# Patient Record
Sex: Female | Born: 2000 | Race: Black or African American | Hispanic: No | Marital: Single | Smoking: Never smoker
Health system: Southern US, Community
[De-identification: ages and names within clinical notes are randomized; demographics above are authoritative.]

---
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
## 2022-01-01 ENCOUNTER — Ambulatory Visit (HOSPITAL_COMMUNITY): Payer: BC Managed Care – PPO | Attending: Neurosurgery

## 2022-01-01 DIAGNOSIS — M545 Low back pain, unspecified: Secondary | ICD-10-CM | POA: Insufficient documentation

## 2022-01-01 DIAGNOSIS — M549 Dorsalgia, unspecified: Secondary | ICD-10-CM | POA: Diagnosis present

## 2022-01-01 DIAGNOSIS — M5430 Sciatica, unspecified side: Secondary | ICD-10-CM | POA: Insufficient documentation

## 2022-01-01 NOTE — Therapy (Signed)
OUTPATIENT PHYSICAL THERAPY THORACOLUMBAR TREATMENT   Patient Name: Abigail [name] MRN: [id number] DOB:[birth year], 21 y.o., female Today's Date: 01/01/2022   PT End of Session - 01/01/22 1443     Visit Number 6    Number of Visits 12    Date for PT Re-Evaluation 01/24/22   unable to get right back in.   Authorization Type [employer] COMM PPO; no auth required, 30 VL PT/OT and chiro combined; no copay    Authorization - Visit Number 6    Authorization - Number of Visits 30    Progress Note Due on Visit 12    PT Start Time 1440   late   PT Stop Time 1519    PT Time Calculation (min) 39 min    Activity Tolerance Patient tolerated treatment well    Behavior During Therapy WFL for tasks assessed/performed                No past medical history on file. No past surgical history on file. Patient Active Problem List   Diagnosis Date Noted   MDD (major depressive disorder), single episode, severe , no psychosis ([place]) 11/19/2018   Suicidal overdose, subsequent encounter 11/19/2018    PCP: [facility]  REFERRING PROVIDER: Kary Kos, MD  REFERRING DIAG: PT eval/tx for M54.50 lumbago of lumbar region with sciatica per Kary Kos, MD   Rationale for Evaluation and Treatment Rehabilitation  THERAPY DIAG:  Low back pain, unspecified back pain laterality, unspecified chronicity, unspecified whether sciatica present  Back pain with sciatica  ONSET DATE: October and November of last year 2022  SUBJECTIVE:                                                                                                                                                                                           SUBJECTIVE STATEMENT:  pain is a little less overall.  Still feel the pain in the same spot.  PERTINENT HISTORY:  none  PAIN:  Are you having pain? Yes NPRS scale: 3/10 Pain location: low back Pain description: stabbing Aggravating factors: moving Relieving factors:  tylenol, ibuprofen   PLOF: Independent  PATIENT GOALS find out what my problem is and limit the pain   OBJECTIVE:   PATIENT SURVEYS:  FOTO 53   POSTURE: No Significant postural limitations; sits with slightly flexed posture but able to correct with verbal cues.   PALPATION: Tender L2-L5 paraspinals   LUMBAR ROM:   Active  AROM  eval AROM 01/01/22  Flexion 90% available painful Wfl No painful  Extension 70% available painful 80%  Available Mild pain  Right lateral flexion  wfl   Left lateral flexion wfl   Right rotation wfl   Left rotation wfl    (Blank rows = not tested)   LOWER EXTREMITY MMT:    MMT Right eval Left eval  Hip flexion 4+ 4+  Hip extension 4+ 4+  Hip abduction    Hip adduction    Hip internal rotation    Hip external rotation    Knee flexion    Knee extension 5 5  Ankle dorsiflexion 5 5  Ankle plantarflexion    Ankle inversion    Ankle eversion     (Blank rows = not tested)   FUNCTIONAL TESTS:  5 times sit to stand: 21 sec     TODAY'S TREATMENT  01/01/2022 Prone press ups 2 x 10 less pain with standing lumbar extension afterwards Bird dog x 10   Supine: Bridge 2 x 10 Dead bug 2 x 10 Bridge with ball hip adduction x 10 Bridge with hip abduction with belt x 10  Standing shoulder retractions RTB 2 x 10 Standing shoulder extensions RTB 2 x 10       December 30, 2021 Bridge 2x 10  Dead bug 2x 10 Prone hip extension 2x 10 bilateral Plank on elbows/knees 5x 10 second holds 2 sets Squat 2x 10  Palof press 1x10 GTB           12/18/21             Supine:  bridge x 10                           Heel slide with leg 1" off floor x 10             Side lying : clam x 10             All four :  bird dog x 5              Sitting : sit to stand x 10             Standing excursion x 3                             Heel raises x 10                            Squat x 10                             Wall pushup x 10   11/28/21:  Seated:  anterior/posterior pelvic tilt while therapist assessing SI alignment  Manual MET for Lt SI anterior rotation  Supine: following MET:   Bridge Lt foot closer   SLR floating Rt LE only   Pubic clearing 10x 5" alternating adduction ball with abduction in belt  81mn walk with ab set 452 ft  Paloff walk out 5RT        11/27/21 Reviewed goals, educated importance of HEP compliance for maximal benefits, reviewed current HEP. Prone:  Prone hip extension (cueing for form/mechanics)  Prone on elbow 1 min  Prone press up 10x 10"  Supine:  Bridge (cueing for core engangement) Cueing to reduce IR  Posterior pelvic tilt 10x 10"  Dead bug 10x5"  Clam with RTB 10x 5" each Quadruped:  Cat/camel 5x 10"  UE extension 5x  LE extension 10x  UE/LE 10x  Physical therapy evaluation, HEP instruction, postural instruction, use of heat and ice for pain management.   PATIENT EDUCATION:  Education details:  12/20/21: HEP Eval: HEP instruction, postural instruction, use of heat and ice for pain management. Person educated: Patient Education method: Explanation, Demonstration, and Handouts Education comprehension: verbalized understanding, returned demonstration, verbal cues required, tactile cues required, and needs further education   HOME EXERCISE PROGRAM:  Access Code: [email or web address] URL: [email or web address] Date: 01/01/2022 Prepared by: AP - Rehab  Exercises - Supine Posterior Pelvic Tilt  - 2-3 x daily - 7 x weekly - 1 sets - 5 reps - Supine Bridge  - 2-3 x daily - 7 x weekly - 3 sets - 10 reps - Prone Hip Extension  - 2-3 x daily - 7 x weekly - 3 sets - 10 reps - Plank on Knees  - 1 x daily - 7 x weekly - 2 sets - 5 reps - 10 second hold - Prone Press Up  - 1 x daily - 7 x weekly - 3 sets - 10 reps ASSESSMENT:  CLINICAL IMPRESSION: Today's session continued to focus on lumbar and core strengthening.  Added hip adduction with ball and hip abduction with belt without issue;  seems to have some relief with extensions today and no complaint of  pain at the end of treatment. Patient will continue to benefit from physical therapy in order to improve function and reduce impairment.    OBJECTIVE IMPAIRMENTS decreased activity tolerance, decreased coordination, decreased endurance, decreased knowledge of condition, decreased mobility, decreased ROM, decreased strength, hypomobility, impaired perceived functional ability, impaired flexibility, postural dysfunction, and pain.   ACTIVITY LIMITATIONS carrying, lifting, bending, sitting, standing, squatting, and transfers  PARTICIPATION LIMITATIONS: cleaning, laundry, shopping, community activity, occupation, and yard work  PERSONAL FACTORS Fitness are also affecting patient's functional outcome.   REHAB POTENTIAL: Good  CLINICAL DECISION MAKING: Stable/uncomplicated  EVALUATION COMPLEXITY: Low   GOALS: Goals reviewed with patient? No  SHORT TERM GOALS: Target date: 12/04/2021   patient will be independent with initial HEP  Baseline: Goal status: met    LONG TERM GOALS: Target date: 12/11/2021  Patient will be independent with advanced HEP and self management strategies to improve quality of life and functional outcomes.  Baseline:  Goal status: Ongoing  2.   Patient will improve FOTO score to predicted value to demonstrate improved functional mobility.  Baseline: 53 Goal status: Ongoing  3.  Patient will improve 5 x STS score from 21 sec to 15 sec to demonstrate improved functional mobility and increased lower extremity strength.  Baseline: 21 sec Goal status: Ongoing  4.  Patient will be able to demonstrate pain free lumbar motion in all motions/directions (flexion/extension/SB R/SB L/ ROT R/ ROT L)  Baseline:  Goal status: Ongoing   PLAN: PT FREQUENCY: 2x/week  PT DURATION: 3 weeks  PLANNED INTERVENTIONS: Therapeutic exercises, Therapeutic activity, Neuromuscular re-education, Balance  training, Gait training, Patient/Family education, Joint manipulation, Joint mobilization, Stair training, Vestibular training, Canalith repositioning, Visual/preceptual remediation/compensation, Orthotic/Fit training, DME instructions, Aquatic Therapy, Dry Needling, Electrical stimulation, Spinal manipulation, Spinal mobilization, Cryotherapy, Moist heat, Manual lymph drainage, Compression bandaging, scar mobilization, Taping, Traction, Ultrasound, Contrast bath, Biofeedback, Ionotophoresis 39m/ml Dexamethasone, Manual therapy, and Re-evaluation.  PLAN FOR NEXT SESSION: Progress lumbar stabilization  Progress core strength with additional planks, and higher stability exercises.  3:15 PM, 01/01/22 [place] Neita Landrigan MPT [facility] [place] #[phone]

## 2022-01-03 ENCOUNTER — Ambulatory Visit (HOSPITAL_COMMUNITY): Payer: BC Managed Care – PPO

## 2022-01-03 ENCOUNTER — Encounter (HOSPITAL_COMMUNITY): Payer: BC Managed Care – PPO

## 2022-01-03 DIAGNOSIS — M5430 Sciatica, unspecified side: Secondary | ICD-10-CM

## 2022-01-03 DIAGNOSIS — M545 Low back pain, unspecified: Secondary | ICD-10-CM

## 2022-01-03 DIAGNOSIS — M549 Dorsalgia, unspecified: Secondary | ICD-10-CM

## 2022-01-03 NOTE — Therapy (Signed)
OUTPATIENT PHYSICAL THERAPY THORACOLUMBAR TREATMENT   Patient Name: Abigail [name] MRN: [id number] DOB:Dec 31, 2000, 21 y.o., female Today's Date: 01/03/2022   PT End of Session - 01/03/22 0820     Visit Number 7    Number of Visits 12    Date for PT Re-Evaluation 01/24/22   unable to get right back in.   Authorization Type [employer] COMM PPO; no auth required, 30 VL PT/OT and chiro combined; no copay    Authorization - Visit Number 6    Authorization - Number of Visits 30    Progress Note Due on Visit 12    PT Start Time 0820    PT Stop Time 0900    PT Time Calculation (min) 40 min    Activity Tolerance Patient tolerated treatment well    Behavior During Therapy WFL for tasks assessed/performed                 No past medical history on file. No past surgical history on file. Patient Active Problem List   Diagnosis Date Noted   MDD (major depressive disorder), single episode, severe , no psychosis ([place]) 11/19/2018   Suicidal overdose, subsequent encounter 11/19/2018    PCP: [facility]  REFERRING PROVIDER: Kary Kos, MD  REFERRING DIAG: PT eval/tx for M54.50 lumbago of lumbar region with sciatica per Kary Kos, MD   Rationale for Evaluation and Treatment Rehabilitation  THERAPY DIAG:  Low back pain, unspecified back pain laterality, unspecified chronicity, unspecified whether sciatica present  Back pain with sciatica  ONSET DATE: October and November of last year 2022  SUBJECTIVE:                                                                                                                                                                                           SUBJECTIVE STATEMENT:  no pain today  PERTINENT HISTORY:  none  PAIN:  Are you having pain? Yes NPRS scale: 0/10 Pain location: low back Pain description: stabbing Aggravating factors: moving Relieving factors: tylenol, ibuprofen   PLOF: Independent  PATIENT GOALS find  out what my problem is and limit the pain   OBJECTIVE:   PATIENT SURVEYS:  FOTO 53   POSTURE: No Significant postural limitations; sits with slightly flexed posture but able to correct with verbal cues.   PALPATION: Tender L2-L5 paraspinals   LUMBAR ROM:   Active  AROM  eval AROM 01/01/22  Flexion 90% available painful Wfl No painful  Extension 70% available painful 80%  Available Mild pain  Right lateral flexion wfl   Left lateral flexion wfl   Right rotation wfl  Left rotation wfl    (Blank rows = not tested)   LOWER EXTREMITY MMT:    MMT Right eval Left eval  Hip flexion 4+ 4+  Hip extension 4+ 4+  Hip abduction    Hip adduction    Hip internal rotation    Hip external rotation    Knee flexion    Knee extension 5 5  Ankle dorsiflexion 5 5  Ankle plantarflexion    Ankle inversion    Ankle eversion     (Blank rows = not tested)   FUNCTIONAL TESTS:  5 times sit to stand: 21 sec     TODAY'S TREATMENT  01/03/22 Prone press ups 2 x 10 ( at the beginning and end of mat exercise) Plank 2 x 20"  Supine: Bridge 2 x 10 Dead bug 2 x 10 with 2# each upper extremity Bridge with ball hip adduction 2  x 10 Bridge with hip abduction with belt 2 x 10  Quadruped  Alternate arm/leg (bird dog) x 10  Nustep seat 5, arms 6 level 4 x 5'  Standing: GTB shoulder rows and extension 2 x 10    01/01/2022 Prone press ups 2 x 10 less pain with standing lumbar extension afterwards Bird dog x 10   Supine: Bridge 2 x 10 Dead bug 2 x 10 Bridge with ball hip adduction x 10 Bridge with hip abduction with belt x 10  Standing shoulder retractions RTB 2 x 10 Standing shoulder extensions RTB 2 x 10       01/16/22 Bridge 2x 10  Dead bug 2x 10 Prone hip extension 2x 10 bilateral Plank on elbows/knees 5x 10 second holds 2 sets Squat 2x 10  Palof press 1x10 GTB           12/18/21             Supine:  bridge x 10                           Heel slide with  leg 1" off floor x 10             Side lying : clam x 10             All four :  bird dog x 5              Sitting : sit to stand x 10             Standing excursion x 3                             Heel raises x 10                            Squat x 10                             Wall pushup x 10   11/28/21:  Seated: anterior/posterior pelvic tilt while therapist assessing SI alignment  Manual MET for Lt SI anterior rotation  Supine: following MET:   Bridge Lt foot closer   SLR floating Rt LE only   Pubic clearing 10x 5" alternating adduction ball with abduction in belt  63mn walk with ab set 452 ft  Paloff walk out 5RT        11/27/21  Reviewed goals, educated importance of HEP compliance for maximal benefits, reviewed current HEP. Prone:  Prone hip extension (cueing for form/mechanics)  Prone on elbow 1 min  Prone press up 10x 10"  Supine:  Bridge (cueing for core engangement) Cueing to reduce IR  Posterior pelvic tilt 10x 10"  Dead bug 10x5"  Clam with RTB 10x 5" each Quadruped:  Cat/camel 5x 10"  UE extension 5x   LE extension 10x  UE/LE 10x  Physical therapy evaluation, HEP instruction, postural instruction, use of heat and ice for pain management.   PATIENT EDUCATION:  Education details: 01/03/22 added plank to HEP 12/20/21: HEP Eval: HEP instruction, postural instruction, use of heat and ice for pain management. Person educated: Patient Education method: Explanation, Demonstration, and Handouts Education comprehension: verbalized understanding, returned demonstration, verbal cues required, tactile cues required, and needs further education   HOME EXERCISE PROGRAM: Access Code: [email or web address] URL: [email or web address] Date: 01/03/2022 Prepared by: AP - Rehab  Exercises - Supine Posterior Pelvic Tilt  - 2-3 x daily - 7 x weekly - 1 sets - 5 reps - Supine Bridge  - 2-3 x daily - 7 x weekly - 3 sets - 10 reps - Prone Hip Extension  - 2-3 x daily - 7 x  weekly - 3 sets - 10 reps - Plank on Knees  - 1 x daily - 7 x weekly - 2 sets - 5 reps - 10 second hold - Prone Press Up  - 2 x daily - 7 x weekly - 1 sets - 10 reps - Standard Plank  - 2 x daily - 7 x weekly - 1 sets - 2 reps - 20" hold   ASSESSMENT:  CLINICAL IMPRESSION:  Patient reports no pain at the start of treatment. Today's session continued to focus on lumbar and core strengthening.  Progressed bridging exercise with reps and added weight to dead bug.  Added plank today; patient fatigues quickly and needs cues for technique with plank. Patient will continue to benefit from physical therapy in order to improve function and reduce impairment.    OBJECTIVE IMPAIRMENTS decreased activity tolerance, decreased coordination, decreased endurance, decreased knowledge of condition, decreased mobility, decreased ROM, decreased strength, hypomobility, impaired perceived functional ability, impaired flexibility, postural dysfunction, and pain.   ACTIVITY LIMITATIONS carrying, lifting, bending, sitting, standing, squatting, and transfers  PARTICIPATION LIMITATIONS: cleaning, laundry, shopping, community activity, occupation, and yard work  PERSONAL FACTORS Fitness are also affecting patient's functional outcome.   REHAB POTENTIAL: Good  CLINICAL DECISION MAKING: Stable/uncomplicated  EVALUATION COMPLEXITY: Low   GOALS: Goals reviewed with patient? No  SHORT TERM GOALS: Target date: 12/04/2021   patient will be independent with initial HEP  Baseline: Goal status: met    LONG TERM GOALS: Target date: 12/11/2021  Patient will be independent with advanced HEP and self management strategies to improve quality of life and functional outcomes.  Baseline:  Goal status: Ongoing  2.   Patient will improve FOTO score to predicted value to demonstrate improved functional mobility.  Baseline: 53 Goal status: Ongoing  3.  Patient will improve 5 x STS score from 21 sec to 15 sec to  demonstrate improved functional mobility and increased lower extremity strength.  Baseline: 21 sec Goal status: Ongoing  4.  Patient will be able to demonstrate pain free lumbar motion in all motions/directions (flexion/extension/SB R/SB L/ ROT R/ ROT L)  Baseline:  Goal status: Ongoing   PLAN: PT FREQUENCY: 2x/week  PT DURATION: 3 weeks  PLANNED INTERVENTIONS:  Therapeutic exercises, Therapeutic activity, Neuromuscular re-education, Balance training, Gait training, Patient/Family education, Joint manipulation, Joint mobilization, Stair training, Vestibular training, Canalith repositioning, Visual/preceptual remediation/compensation, Orthotic/Fit training, DME instructions, Aquatic Therapy, Dry Needling, Electrical stimulation, Spinal manipulation, Spinal mobilization, Cryotherapy, Moist heat, Manual lymph drainage, Compression bandaging, scar mobilization, Taping, Traction, Ultrasound, Contrast bath, Biofeedback, Ionotophoresis 42m/ml Dexamethasone, Manual therapy, and Re-evaluation.  PLAN FOR NEXT SESSION: Progress lumbar stabilization  Progress core strength with additional planks, and higher stability exercises.  8:54 AM, 01/03/22 [place] Arnaldo Heffron MPT [facility] [place] #[phone]

## 2022-01-08 ENCOUNTER — Encounter (HOSPITAL_COMMUNITY): Payer: BC Managed Care – PPO | Admitting: Physical Therapy

## 2022-01-10 ENCOUNTER — Encounter (HOSPITAL_COMMUNITY): Payer: Self-pay | Admitting: Physical Therapy

## 2022-01-10 ENCOUNTER — Ambulatory Visit (HOSPITAL_COMMUNITY): Payer: BC Managed Care – PPO | Admitting: Physical Therapy

## 2022-01-10 DIAGNOSIS — M549 Dorsalgia, unspecified: Secondary | ICD-10-CM

## 2022-01-10 DIAGNOSIS — M545 Low back pain, unspecified: Secondary | ICD-10-CM | POA: Diagnosis not present

## 2022-01-10 NOTE — Therapy (Signed)
OUTPATIENT PHYSICAL THERAPY THORACOLUMBAR TREATMENT   Patient Name: Abigail [name] MRN: [id number] DOB:09-28-2000, 21 y.o., female Today's Date: 01/10/2022   PT End of Session - 01/10/22 0735     Visit Number 8    Number of Visits 12    Date for PT Re-Evaluation 01/24/22   unable to get right back in.   Authorization Type [employer] COMM PPO; no auth required, 30 VL PT/OT and chiro combined; no copay; Secondary [employer]    Authorization Time Period well[employer] approved 10 visits from 5/30 - 7/29    Authorization - Visit Number 8    Authorization - Number of Visits 10    Progress Note Due on Visit 12    PT Start Time 0735    PT Stop Time 0813    PT Time Calculation (min) 38 min    Activity Tolerance Patient tolerated treatment well    Behavior During Therapy [facility] for tasks assessed/performed                 History reviewed. No pertinent past medical history. History reviewed. No pertinent surgical history. Patient Active Problem List   Diagnosis Date Noted   MDD (major depressive disorder), single episode, severe , no psychosis ([place]) 11/19/2018   Suicidal overdose, subsequent encounter 11/19/2018    PCP: [facility]  REFERRING PROVIDER: Kary Kos, MD  REFERRING DIAG: PT eval/tx for M54.50 lumbago of lumbar region with sciatica per Kary Kos, MD   Rationale for Evaluation and Treatment Rehabilitation  THERAPY DIAG:  Low back pain, unspecified back pain laterality, unspecified chronicity, unspecified whether sciatica present  Back pain with sciatica  ONSET DATE: October and November of last year 2022  SUBJECTIVE:                                                                                                                                                                                           SUBJECTIVE STATEMENT:  Sometimes pain with moving a certain way but not as bad as it was. Feels she will be ready to d/c at end of POC.  PERTINENT  HISTORY:  none  PAIN:  Are you having pain? Yes NPRS scale: 0/10 Pain location: low back Pain description: stabbing Aggravating factors: moving Relieving factors: tylenol, ibuprofen   PLOF: Independent  PATIENT GOALS find out what my problem is and limit the pain   OBJECTIVE:   PATIENT SURVEYS:  FOTO 53   POSTURE: No Significant postural limitations; sits with slightly flexed posture but able to correct with verbal cues.   PALPATION: Tender L2-L5 paraspinals   LUMBAR ROM:   Active  AROM  eval AROM 01/01/22  Flexion 90% available painful Wfl No painful  Extension 70% available painful 80%  Available Mild pain  Right lateral flexion wfl   Left lateral flexion wfl   Right rotation wfl   Left rotation wfl    (Blank rows = not tested)   LOWER EXTREMITY MMT:    MMT Right eval Left eval  Hip flexion 4+ 4+  Hip extension 4+ 4+  Hip abduction    Hip adduction    Hip internal rotation    Hip external rotation    Knee flexion    Knee extension 5 5  Ankle dorsiflexion 5 5  Ankle plantarflexion    Ankle inversion    Ankle eversion     (Blank rows = not tested)   FUNCTIONAL TESTS:  5 times sit to stand: 21 sec     TODAY'S TREATMENT  01/10/22 Prone press up 2x 10  Plank 3x 20 second holds Birddog 2x 10 bilateral  Dead bug 2x 10 Squat 8# dumbbell 2x 10  Sumo squat 8# 2x 10  Lateral walkouts 2 plates 1x 5 bilateral    01/03/22 Prone press ups 2 x 10 ( at the beginning and end of mat exercise) Plank 2 x 20"  Supine: Bridge 2 x 10 Dead bug 2 x 10 with 2# each upper extremity Bridge with ball hip adduction 2  x 10 Bridge with hip abduction with belt 2 x 10  Quadruped  Alternate arm/leg (bird dog) x 10  Nustep seat 5, arms 6 level 4 x 5'  Standing: GTB shoulder rows and extension 2 x 10    01/01/2022 Prone press ups 2 x 10 less pain with standing lumbar extension afterwards Bird dog x 10   Supine: Bridge 2 x 10 Dead bug 2 x  10 Bridge with ball hip adduction x 10 Bridge with hip abduction with belt x 10  Standing shoulder retractions RTB 2 x 10 Standing shoulder extensions RTB 2 x 10    01-14-22 Bridge 2x 10  Dead bug 2x 10 Prone hip extension 2x 10 bilateral Plank on elbows/knees 5x 10 second holds 2 sets Squat 2x 10  Palof press 1x10 GTB           PATIENT EDUCATION:  Education details: 01/03/22 added plank to HEP January 14, 2022: HEP Eval: HEP instruction, postural instruction, use of heat and ice for pain management. Person educated: Patient Education method: Explanation, Demonstration, and Handouts Education comprehension: verbalized understanding, returned demonstration, verbal cues required, tactile cues required, and needs further education   HOME EXERCISE PROGRAM: Access Code: [email or web address] URL: [email or web address] Date: 01/03/2022 Prepared by: [facility] - Rehab  Exercises - Supine Posterior Pelvic Tilt  - 2-3 x daily - 7 x weekly - 1 sets - 5 reps - Supine Bridge  - 2-3 x daily - 7 x weekly - 3 sets - 10 reps - Prone Hip Extension  - 2-3 x daily - 7 x weekly - 3 sets - 10 reps - Plank on Knees  - 1 x daily - 7 x weekly - 2 sets - 5 reps - 10 second hold - Prone Press Up  - 2 x daily - 7 x weekly - 1 sets - 10 reps - Standard Plank  - 2 x daily - 7 x weekly - 1 sets - 2 reps - 20" hold   ASSESSMENT:  CLINICAL IMPRESSION: Began session with press ups and core strengthening exercises at table which are tolerated well. She demonstrates good mechanics  with weighted squatting exercises. Anticipate d/c at end of POC. Patient will continue to benefit from physical therapy in order to improve function and reduce impairment.    OBJECTIVE IMPAIRMENTS decreased activity tolerance, decreased coordination, decreased endurance, decreased knowledge of condition, decreased mobility, decreased ROM, decreased strength, hypomobility, impaired perceived functional ability, impaired flexibility, postural  dysfunction, and pain.   ACTIVITY LIMITATIONS carrying, lifting, bending, sitting, standing, squatting, and transfers  PARTICIPATION LIMITATIONS: cleaning, laundry, shopping, community activity, occupation, and yard work  PERSONAL FACTORS Fitness are also affecting patient's functional outcome.   REHAB POTENTIAL: Good  CLINICAL DECISION MAKING: Stable/uncomplicated  EVALUATION COMPLEXITY: Low   GOALS: Goals reviewed with patient? No  SHORT TERM GOALS: Target date: 12/04/2021   patient will be independent with initial HEP  Baseline: Goal status: met    LONG TERM GOALS: Target date: 12/11/2021  Patient will be independent with advanced HEP and self management strategies to improve quality of life and functional outcomes.  Baseline:  Goal status: Ongoing  2.   Patient will improve FOTO score to predicted value to demonstrate improved functional mobility.  Baseline: 53 Goal status: Ongoing  3.  Patient will improve 5 x STS score from 21 sec to 15 sec to demonstrate improved functional mobility and increased lower extremity strength.  Baseline: 21 sec Goal status: Ongoing  4.  Patient will be able to demonstrate pain free lumbar motion in all motions/directions (flexion/extension/SB R/SB L/ ROT R/ ROT L)  Baseline:  Goal status: Ongoing   PLAN: PT FREQUENCY: 2x/week  PT DURATION: 3 weeks  PLANNED INTERVENTIONS: Therapeutic exercises, Therapeutic activity, Neuromuscular re-education, Balance training, Gait training, Patient/Family education, Joint manipulation, Joint mobilization, Stair training, Vestibular training, Canalith repositioning, Visual/preceptual remediation/compensation, Orthotic/Fit training, DME instructions, Aquatic Therapy, Dry Needling, Electrical stimulation, Spinal manipulation, Spinal mobilization, Cryotherapy, Moist heat, Manual lymph drainage, Compression bandaging, scar mobilization, Taping, Traction, Ultrasound, Contrast bath, Biofeedback,  Ionotophoresis 24m/ml Dexamethasone, Manual therapy, and Re-evaluation.  PLAN FOR NEXT SESSION: Progress lumbar stabilization  Progress core strength with additional planks, and higher stability exercises.  7:39 AM, 01/10/22 AMearl LatinPT, DPT [facility] The[facility]

## 2022-01-12 ENCOUNTER — Ambulatory Visit (HOSPITAL_COMMUNITY)
Admission: EM | Admit: 2022-01-12 | Discharge: 2022-01-12 | Disposition: A | Discharge status: Home / Self Care | Payer: BC Managed Care – PPO

## 2022-01-12 ENCOUNTER — Encounter (HOSPITAL_COMMUNITY): Payer: Self-pay | Admitting: Emergency Medicine

## 2022-01-12 DIAGNOSIS — R21 Rash and other nonspecific skin eruption: Secondary | ICD-10-CM

## 2022-01-12 MED ORDER — PREDNISONE 20 MG PO TABS: 40.0000 mg | ORAL_TABLET | Freq: Every day | ORAL | 0 refills | Status: AC

## 2022-01-12 MED ORDER — HYDROCORTISONE 1 % EX CREA: TOPICAL_CREAM | CUTANEOUS | 0 refills | Status: AC

## 2022-01-12 NOTE — ED Provider Notes (Signed)
[facility]    CSN: [id number] Arrival date & time: 01/12/22  1620      History   Chief Complaint Chief Complaint  Patient presents with   Rash    HPI Abigail [name] is a 21 y.o. female.   Patient presents to urgent care for evaluation of rash to her left arm that started a few days ago and has now spread to her chest and chin.  Rash is very itchy.  Patient denies recent exposure to new allergens or new bedding, personal hygiene products, or outdoor plants.  She denies recent travel, camping, and exposure to chemical irritants.  She believes that the rash may have been caused by her pets and states that her dogs typically go outside and run in the bushes/woods frequently.  Her dogs are up-to-date on all of their vaccines and flea and tick prevention.  No known poison ivy/oak in the backyard but states that they do have a large wooded area where the dogs typically like to run.  She has attempted use of calamine lotion to the rash on her face and her arm without much relief.  Rash is not currently draining but she states that it has gotten worse and spread significantly over the last few days.  No sore throat, nausea, vomiting, ear pain, URI symptoms, fever/chills, or headache.     History reviewed. No pertinent past medical history.  Patient Active Problem List   Diagnosis Date Noted   MDD (major depressive disorder), single episode, severe , no psychosis ([facility]) 11/19/2018   Suicidal overdose, subsequent encounter 11/19/2018    History reviewed. No pertinent surgical history.  OB History   No obstetric history on file.      Home Medications    Prior to Admission medications   Medication Sig Start Date End Date Taking? Authorizing Provider  hydrocortisone cream 1 % Apply to affected area 2 times daily 01/12/22  Yes Laporscha Linehan, Donavan Burnet, FNP  predniSONE (DELTASONE) 20 MG tablet Take 2 tablets (40 mg total) by mouth daily for 5 days. 01/12/22 01/17/22 Yes  StanhopeDonavan Burnet, FNP  DAYSEE 0.15-0.03 &0.01 MG tablet Take 1 tablet by mouth daily. 11/12/21   [provider]  medroxyPROGESTERone Acetate 150 MG/ML SUSY Inject 1 mL as directed every 3 (three) months. 09/03/18   [provider]  venlafaxine XR (EFFEXOR-XR) 150 MG 24 hr capsule Take 150 mg by mouth daily. 10/25/21   [provider]    Family History No family history on file.  Social History Social History   Tobacco Use   Smoking status: Never   Smokeless tobacco: Never  Vaping Use   Vaping Use: Never used  Substance Use Topics   Alcohol use: Never   Drug use: Never     Allergies   Patient has no known allergies.   Review of Systems Review of Systems Per HPI  Physical Exam Triage Vital Signs ED Triage Vitals  Enc Vitals Group     BP 01/12/22 1646 120/84     Pulse Rate 01/12/22 1646 83     Resp 01/12/22 1646 15     Temp 01/12/22 1646 98 F (36.7 C)     Temp Source 01/12/22 1646 Oral     SpO2 01/12/22 1646 98 %     Weight --      Height --      Head Circumference --      Peak Flow --      Pain Score  01/12/22 1643 0     Pain Loc --      Pain Edu? --      Excl. in GC? --    No data found.  Updated Vital Signs BP 120/84 (BP Location: Right Arm)   Pulse 83   Temp 98 F (36.7 C) (Oral)   Resp 15   LMP 12/10/2021   SpO2 98%   Visual Acuity Right Eye Distance:   Left Eye Distance:   Bilateral Distance:    Right Eye Near:   Left Eye Near:    Bilateral Near:     Physical Exam Vitals and nursing note reviewed.  Constitutional:      Appearance: Normal appearance. She is not ill-appearing or toxic-appearing.     Comments: Very pleasant patient sitting on exam in position of comfort table in no acute distress.   HENT:     Head: Normocephalic and atraumatic.     Right Ear: Hearing and external ear normal.     Left Ear: Hearing and external ear normal.     Nose: Nose normal.     Mouth/Throat:     Lips: Pink.     Mouth:  Mucous membranes are moist.  Eyes:     General: Lids are normal. Vision grossly intact. Gaze aligned appropriately.     Extraocular Movements: Extraocular movements intact.     Conjunctiva/sclera: Conjunctivae normal.  Pulmonary:     Effort: Pulmonary effort is normal.  Abdominal:     Palpations: Abdomen is soft.  Musculoskeletal:     Cervical back: Neck supple.  Skin:    General: Skin is warm and dry.     Capillary Refill: Capillary refill takes less than 2 seconds.     Findings: No rash.     Comments: Erythematous macular papular rash that is not draining present to the patient's left forearm, left shin, and right inferior clavicular area.  Rash is very itchy.  No signs of excoriation to the skin present.  There is no drainage from the rash at this time.   Neurological:     General: No focal deficit present.     Mental Status: She is alert and oriented to person, place, and time. Mental status is at baseline.     Cranial Nerves: No dysarthria or facial asymmetry.     Gait: Gait is intact.  Psychiatric:        Mood and Affect: Mood normal.        Speech: Speech normal.        Behavior: Behavior normal.        Thought Content: Thought content normal.        Judgment: Judgment normal.           UC Treatments / Results  Labs (all labs ordered are listed, but only abnormal results are displayed) Labs Reviewed - No data to display  EKG   Radiology No results found.  Procedures Procedures (including critical care time)  Medications Ordered in UC Medications - No data to display  Initial Impression / Assessment and Plan / UC Course  I have reviewed the triage vital signs and the nursing notes.  Pertinent labs & imaging results that were available during my care of the patient were reviewed by me and considered in my medical decision making (see chart for details).  1.  Rash and nonspecific skin eruption Unsure of etiology of patient's rash, although I suspect it  is a contact dermatitis related to secondary exposure to  allergen brought in by her pet dogs.  We will manage this as possible poison oak/poison ivy.  Patient to take prednisone 40 mg once daily for the next 5 days with breakfast.  Advised patient to take this with food to avoid stomach upset.  No ibuprofen or other NSAID containing medications while she is on the prednisone due to increased risk of GI bleeding.  She may use hydrocortisone cream to the rash twice daily for the next 7 to 10 days for itch and inflammation.  Advised patient to wash hands frequently to avoid spread of rash and wash her bed linen as well as other frequently touched surfaces.  Instructed to follow-up with urgent care if symptoms do not improve in the next 3 to 4 days with prednisone use.   Discussed physical exam and available lab work findings in clinic with patient.  Counseled patient regarding appropriate use of medications and potential side effects for all medications recommended or prescribed today. Discussed red flag signs and symptoms of worsening condition,when to call the PCP office, return to urgent care, and when to seek higher level of care in the emergency department. Patient verbalizes understanding and agreement with plan. All questions answered. Patient discharged in stable condition.  Final Clinical Impressions(s) / UC Diagnoses   Final diagnoses:  Rash and nonspecific skin eruption     Discharge Instructions      Take prednisone once daily for the next 5 days with breakfast.  Take this medicine with food to avoid stomach upset. Apply hydrocortisone cream to the rash twice daily for the next 7 to 10 days for itch and inflammation. Do not take any NSAID containing medications such as naproxen, Aleve, ibuprofen, Motrin, aspirin, or Goody powders while you are taking prednisone.   Wash your hands frequently and avoid itching the rash as this rash will likely spread quickly if exposed to other areas of your  body.  Follow-up with urgent care as needed and if the rash does not improve in the next 3-4 days.      ED Prescriptions     Medication Sig Dispense Auth. Provider   predniSONE (DELTASONE) 20 MG tablet Take 2 tablets (40 mg total) by mouth daily for 5 days. 10 tablet Carlisle Beers, FNP   hydrocortisone cream 1 % Apply to affected area 2 times daily 15 g Carlisle Beers, FNP      PDMP not reviewed this encounter.   Carlisle Beers, [place] 01/12/22 2147

## 2022-01-12 NOTE — ED Triage Notes (Signed)
Pt reports rash on arms, face, torso for couple days. Hasnt tried any medications for itching or burning.

## 2022-01-12 NOTE — Discharge Instructions (Addendum)
Take prednisone once daily for the next 5 days with breakfast.  Take this medicine with food to avoid stomach upset. Apply hydrocortisone cream to the rash twice daily for the next 7 to 10 days for itch and inflammation. Do not take any NSAID containing medications such as naproxen, Aleve, ibuprofen, Motrin, aspirin, or Goody powders while you are taking prednisone.   Wash your hands frequently and avoid itching the rash as this rash will likely spread quickly if exposed to other areas of your body.  Follow-up with urgent care as needed and if the rash does not improve in the next 3-4 days.

## 2022-01-14 ENCOUNTER — Encounter (HOSPITAL_COMMUNITY): Payer: BC Managed Care – PPO | Admitting: Physical Therapy

## 2022-01-16 ENCOUNTER — Encounter (HOSPITAL_COMMUNITY): Payer: BC Managed Care – PPO

## 2022-06-11 ENCOUNTER — Encounter (HOSPITAL_COMMUNITY): Payer: Self-pay

## 2022-06-11 NOTE — Therapy (Signed)
PHYSICAL THERAPY DISCHARGE SUMMARY  Visits from Start of Care: 8  Current functional level related to goals / functional outcomes: unknown   Remaining deficits: unknown   Education / Equipment: NA   Patient agrees to discharge. Patient goals were partially met Patient is being discharged due to not returning since the last visit.

## 2023-09-11 ENCOUNTER — Ambulatory Visit: Admission: EM | Admit: 2023-09-11 | Discharge: 2023-09-11 | Disposition: A | Discharge status: Home / Self Care

## 2023-09-11 DIAGNOSIS — B349 Viral infection, unspecified: Secondary | ICD-10-CM | POA: Diagnosis not present

## 2023-09-11 NOTE — ED Provider Notes (Signed)
 [facility]    CSN: [id number] Arrival date & time: 09/11/23  1214      History   Chief Complaint No chief complaint on file.   HPI Abigail [name] is a 23 y.o. female.   The history is provided by the patient.   Patient presents for a 1 day history of nausea, lightheadedness, and generalized fatigue.  Patient states today, she is feeling better.  She denies fever, chills, headache, dizziness, lightheadedness, nausea, vomiting, diarrhea, or rash.  Patient states she is now eating and drinking normally.  She is requesting a work note.  History reviewed. No pertinent past medical history.  Patient Active Problem List   Diagnosis Date Noted   MDD (major depressive disorder), single episode, severe , no psychosis ([facility]) 11/19/2018   Suicidal overdose, subsequent encounter 11/19/2018    History reviewed. No pertinent surgical history.  OB History   No obstetric history on file.      Home Medications    Prior to Admission medications   Medication Sig Start Date End Date Taking? Authorizing Provider  DAYSEE 0.15-0.03 &0.01 MG tablet Take 1 tablet by mouth daily. 11/12/21   [provider]  hydrocortisone cream 1 % Apply to affected area 2 times daily 01/12/22   Carlisle Beers, FNP  medroxyPROGESTERone Acetate 150 MG/ML SUSY Inject 1 mL as directed every 3 (three) months. 09/03/18   [provider]  venlafaxine XR (EFFEXOR-XR) 150 MG 24 hr capsule Take 150 mg by mouth daily. 10/25/21   [provider]    Family History History reviewed. No pertinent family history.  Social History Social History   Tobacco Use   Smoking status: Never   Smokeless tobacco: Never  Vaping Use   Vaping status: Never Used  Substance Use Topics   Alcohol use: Never   Drug use: Never     Allergies   Patient has no known allergies.   Review of Systems Review of Systems Per HPI  Physical Exam Triage Vital Signs ED Triage Vitals  Encounter  Vitals Group     BP 09/11/23 1239 108/70     Systolic BP Percentile --      Diastolic BP Percentile --      Pulse Rate 09/11/23 1239 79     Resp 09/11/23 1239 18     Temp 09/11/23 1239 98.6 F (37 C)     Temp Source 09/11/23 1239 Oral     SpO2 09/11/23 1239 98 %     Weight --      Height --      Head Circumference --      Peak Flow --      Pain Score 09/11/23 1237 0     Pain Loc --      Pain Education --      Exclude from Growth Chart --    No data found.  Updated Vital Signs BP 108/70 (BP Location: Right Arm)   Pulse 79   Temp 98.6 F (37 C) (Oral)   Resp 18   LMP 08/19/2023 (Within Days)   SpO2 98%   Visual Acuity Right Eye Distance:   Left Eye Distance:   Bilateral Distance:    Right Eye Near:   Left Eye Near:    Bilateral Near:     Physical Exam Vitals and nursing note reviewed.  Constitutional:      General: She is not in acute distress.    Appearance: Normal appearance.  HENT:  Head: Normocephalic.     Right Ear: Tympanic membrane, ear canal and external ear normal.     Left Ear: Tympanic membrane, ear canal and external ear normal.     Nose: Nose normal.     Mouth/Throat:     Mouth: Mucous membranes are moist.  Eyes:     Extraocular Movements: Extraocular movements intact.     Conjunctiva/sclera: Conjunctivae normal.     Pupils: Pupils are equal, round, and reactive to light.  Cardiovascular:     Rate and Rhythm: Normal rate and regular rhythm.     Pulses: Normal pulses.     Heart sounds: Normal heart sounds.  Pulmonary:     Effort: Pulmonary effort is normal. No respiratory distress.     Breath sounds: Normal breath sounds. No stridor. No wheezing, rhonchi or rales.  Abdominal:     General: Bowel sounds are normal.     Palpations: Abdomen is soft.     Tenderness: There is no abdominal tenderness.  Musculoskeletal:     Cervical back: Normal range of motion.  Lymphadenopathy:     Cervical: No cervical adenopathy.  Skin:    General: Skin  is warm and dry.  Neurological:     General: No focal deficit present.     Mental Status: She is alert and oriented to person, place, and time.  Psychiatric:        Mood and Affect: Mood normal.        Behavior: Behavior normal.      UC Treatments / Results  Labs (all labs ordered are listed, but only abnormal results are displayed) Labs Reviewed - No data to display  EKG   Radiology No results found.  Procedures Procedures (including critical care time)  Medications Ordered in UC Medications - No data to display  Initial Impression / Assessment and Plan / UC Course  I have reviewed the triage vital signs and the nursing notes.  Pertinent labs & imaging results that were available during my care of the patient were reviewed by me and considered in my medical decision making (see chart for details).  Symptoms consistent with a viral illness.  Patient was given note to return to work.  Supportive care recommendations were provided and discussed with patient to include fluids, rest, and over-the-counter analgesics.  Patient was in agreement with this plan of care and verbalizes understanding.  All questions were answered.  Patient stable for discharge.  Work note was provided.  Final Clinical Impressions(s) / UC Diagnoses   Final diagnoses:  None   Discharge Instructions   None    ED Prescriptions   None    PDMP not reviewed this encounter.   Abran Cantor, NP 09/11/23 1256

## 2023-09-11 NOTE — Discharge Instructions (Signed)
 Make sure you are drinking at least 8-10 8 ounce glasses of water daily. Recommend a brat diet if necessary until nausea improves. Follow-up in this clinic or with your primary care physician if you experience worsening symptoms. Follow-up as needed.

## 2023-09-11 NOTE — ED Triage Notes (Signed)
 Pt reports lightheadedness and nausea x 1 day states she feels a little better now but would still like to be seen.

## 2024-02-19 ENCOUNTER — Other Ambulatory Visit

## 2024-03-21 ENCOUNTER — Other Ambulatory Visit: Payer: Self-pay

## 2024-03-21 ENCOUNTER — Emergency Department

## 2024-03-21 ENCOUNTER — Encounter: Payer: Self-pay | Admitting: Emergency Medicine

## 2024-03-21 ENCOUNTER — Emergency Department
Admission: EM | Admit: 2024-03-21 | Discharge: 2024-03-21 | Disposition: A | Discharge status: Home / Self Care | Attending: Emergency Medicine | Admitting: Emergency Medicine

## 2024-03-21 DIAGNOSIS — M26629 Arthralgia of temporomandibular joint, unspecified side: Secondary | ICD-10-CM

## 2024-03-21 DIAGNOSIS — R079 Chest pain, unspecified: Secondary | ICD-10-CM | POA: Diagnosis not present

## 2024-03-21 DIAGNOSIS — R6884 Jaw pain: Secondary | ICD-10-CM | POA: Diagnosis present

## 2024-03-21 DIAGNOSIS — M26602 Left temporomandibular joint disorder, unspecified: Secondary | ICD-10-CM | POA: Diagnosis not present

## 2024-03-21 LAB — BASIC METABOLIC PANEL WITH GFR
Anion gap: 10 (ref 5–15)
BUN: 8 mg/dL (ref 6–20)
CO2: 24 mmol/L (ref 22–32)
Calcium: 9 mg/dL (ref 8.9–10.3)
Chloride: 104 mmol/L (ref 98–111)
Creatinine, Ser: 0.61 mg/dL (ref 0.44–1.00)
GFR, Estimated: >60 mL/min (ref >60)
Glucose, Bld: 100 mg/dL — ABNORMAL HIGH (ref 70–99)
Potassium: 4.1 mmol/L (ref 3.5–5.1)
Sodium: 138 mmol/L (ref 135–145)

## 2024-03-21 LAB — CBC
HCT: 33.8 % — ABNORMAL LOW (ref 36.0–46.0)
Hemoglobin: 11 g/dL — ABNORMAL LOW (ref 12.0–15.0)
MCH: 29 pg (ref 26.0–34.0)
MCHC: 32.5 g/dL (ref 30.0–36.0)
MCV: 89.2 fL (ref 80.0–100.0)
Platelets: 311 K/uL (ref 150–400)
RBC: 3.79 MIL/uL — ABNORMAL LOW (ref 3.87–5.11)
RDW: 12.8 % (ref 11.5–15.5)
WBC: 4.2 K/uL (ref 4.0–10.5)
nRBC: 0 % (ref 0.0–0.2)

## 2024-03-21 LAB — TROPONIN I (HIGH SENSITIVITY): Troponin I (High Sensitivity): <2 ng/L (ref <18)

## 2024-03-21 MED ORDER — MELOXICAM 15 MG PO TABS: 15.0000 mg | ORAL_TABLET | Freq: Every day | ORAL | 0 refills | Status: AC

## 2024-03-21 MED ORDER — BACLOFEN 10 MG PO TABS: 10.0000 mg | ORAL_TABLET | Freq: Three times a day (TID) | ORAL | 0 refills | Status: AC

## 2024-03-21 NOTE — ED Triage Notes (Addendum)
 Pt to ED via POV for centralized chest pain that radiates into her Left jaw. Pt denies any SOB. Pt stating pain started about a week ago. Pt Aox4, NAD at this time.  Pt stating had a recent pregnancy at end of July and had a miscarriage the beginning of this month.

## 2024-03-21 NOTE — Discharge Instructions (Signed)
 Apply ice to the left jaw.  Take the medications as prescribed.  Follow-up with your regular doctor if not improving in 2 to 3 days.  Return if worsening.

## 2024-03-21 NOTE — ED Notes (Signed)
 See triage note  Presents with some left sided chest pain  States pain is into jaw  Pain started about 1 week ago

## 2024-03-21 NOTE — ED Provider Notes (Signed)
 [facility] Provider Note    Event Date/Time   First MD Initiated Contact with Patient 03/21/24 1333     (approximate)   History   Chest Pain   HPI  Abigail [name] is a 23 y.o. female with no significant past medical history presents emergency department with complaints of left jaw pain.  Patient states that hurts to open and close and to chew.  States she was not worried about her chest pain but they asked her triage if she had chest pain and she said yes because she very occasionally and rarely has a sharp pain that goes across her chest.  Denies shortness of breath.  No fever chills or cough.  No swelling in lower extremities.  Patient is on birth control.  However she is a non-smoker.      Physical Exam   Triage Vital Signs: ED Triage Vitals  Encounter Vitals Group     BP 03/21/24 1300 128/75     Girls Systolic BP Percentile --      Girls Diastolic BP Percentile --      Boys Systolic BP Percentile --      Boys Diastolic BP Percentile --      Pulse Rate 03/21/24 1300 67     Resp 03/21/24 1300 16     Temp 03/21/24 1300 98.1 F (36.7 C)     Temp Source 03/21/24 1300 Oral     SpO2 03/21/24 1300 100 %     Weight 03/21/24 1308 160 lb (72.6 kg)     Height 03/21/24 1308 5' 3 (1.6 m)     Head Circumference --      Peak Flow --      Pain Score 03/21/24 1307 8     Pain Loc --      Pain Education --      Exclude from Growth Chart --     Most recent vital signs: Vitals:   03/21/24 1300  BP: 128/75  Pulse: 67  Resp: 16  Temp: 98.1 F (36.7 C)  SpO2: 100%     General: Awake, no distress.   CV:  Good peripheral perfusion. regular rate and  rhythm Resp:  Normal effort. Lungs cta, chest nontender Abd:  No distention.   Other:  Left TMJ tender to palpation, pain is reproduced with opening and closing of the jaw.  No abscess noted, no swelling along gum lines, patient has good dention, neck supple, no lymphadenopathy, neurovascular  intact   ED Results / Procedures / Treatments   Labs (all labs ordered are listed, but only abnormal results are displayed) Labs Reviewed  BASIC METABOLIC PANEL WITH GFR - Abnormal; Notable for the following components:      Result Value   Glucose, Bld 100 (*)    All other components within normal limits  CBC - Abnormal; Notable for the following components:   RBC 3.79 (*)    Hemoglobin 11.0 (*)    HCT 33.8 (*)    All other components within normal limits  POC URINE PREG, ED  TROPONIN I (HIGH SENSITIVITY)     EKG  EKG   RADIOLOGY Chest x-ray    PROCEDURES:   Procedures  Critical Care: No Chief Complaint  Patient presents with   Chest Pain      MEDICATIONS ORDERED IN ED: Medications - No data to display   IMPRESSION / MDM / ASSESSMENT AND PLAN / ED COURSE  I reviewed the triage vital signs and the  nursing notes.                              Differential diagnosis includes, but is not limited to, TMJ, chest wall pain, dental abscess, dental pain, PE, CAP  Patient's presentation is most consistent with acute illness with systemic symptoms  EKG shows normal sinus rhythm, no STEMI, see physician read interpret this as being normal  Chest x-ray independently reviewed interpreted by me as being negative for any acute abnormality  Labs are reassuring  Since patient's had chest pain on and off for several weeks and the troponin is normal do not feel that there are any red flags to indicate doing a second troponin.  Also no red flags to be concerned about PE.  Patient is not short of breath, not having consistent chest pain and has not had a cough etc.  Vitals are normal she is not tachycardic.  Therefore feel like PE would be less likely at this time.  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  Will place her on a anti-inflammatory and muscle relaxer.  She is to apply ice.  Follow-up  with her doctor as needed.  Return emergency department for worsening.  Patient is in agreement treatment plan.  Discharged stable condition.      FINAL CLINICAL IMPRESSION(S) / ED DIAGNOSES   Final diagnoses:  TMJ syndrome     Rx / DC Orders   ED Discharge Orders          Ordered    meloxicam (MOBIC) 15 MG tablet  Daily        03/21/24 1358    baclofen (LIORESAL) 10 MG tablet  3 times daily        03/21/24 1358             Note:  This document was prepared using Dragon voice recognition software and may include unintentional dictation errors.    Gasper Devere ORN, PA-C 03/21/24 1416    Dorothyann Drivers, MD 03/21/24 RETHA

## 2024-04-06 ENCOUNTER — Emergency Department

## 2024-04-06 ENCOUNTER — Other Ambulatory Visit: Payer: Self-pay

## 2024-04-06 ENCOUNTER — Emergency Department
Admission: EM | Admit: 2024-04-06 | Discharge: 2024-04-06 | Disposition: A | Discharge status: Home / Self Care | Attending: Emergency Medicine | Admitting: Emergency Medicine

## 2024-04-06 DIAGNOSIS — O034 Incomplete spontaneous abortion without complication: Secondary | ICD-10-CM | POA: Insufficient documentation

## 2024-04-06 LAB — CBC WITH DIFFERENTIAL/PLATELET
Abs Immature Granulocytes: 0 K/uL (ref 0.00–0.07)
Basophils Absolute: 0 K/uL (ref 0.0–0.1)
Basophils Relative: 0 %
Eosinophils Absolute: 0 K/uL (ref 0.0–0.5)
Eosinophils Relative: 1 %
HCT: 35.9 % — ABNORMAL LOW (ref 36.0–46.0)
Hemoglobin: 11.7 g/dL — ABNORMAL LOW (ref 12.0–15.0)
Immature Granulocytes: 0 %
Lymphocytes Relative: 51 %
Lymphs Abs: 1.7 K/uL (ref 0.7–4.0)
MCH: 28.7 pg (ref 26.0–34.0)
MCHC: 32.6 g/dL (ref 30.0–36.0)
MCV: 88.2 fL (ref 80.0–100.0)
Monocytes Absolute: 0.4 K/uL (ref 0.1–1.0)
Monocytes Relative: 11 %
Neutro Abs: 1.3 K/uL — ABNORMAL LOW (ref 1.7–7.7)
Neutrophils Relative %: 37 %
Platelets: 236 K/uL (ref 150–400)
RBC: 4.07 MIL/uL (ref 3.87–5.11)
RDW: 12.3 % (ref 11.5–15.5)
WBC: 3.5 K/uL — ABNORMAL LOW (ref 4.0–10.5)
nRBC: 0 % (ref 0.0–0.2)

## 2024-04-06 LAB — URINALYSIS, ROUTINE W REFLEX MICROSCOPIC
Bacteria, UA: NONE SEEN
RBC / HPF: >50 RBC/hpf (ref 0–5)
WBC, UA: >50 WBC/hpf (ref 0–5)

## 2024-04-06 LAB — BASIC METABOLIC PANEL WITH GFR
Anion gap: 10 (ref 5–15)
BUN: 9 mg/dL (ref 6–20)
CO2: 24 mmol/L (ref 22–32)
Calcium: 9.5 mg/dL (ref 8.9–10.3)
Chloride: 104 mmol/L (ref 98–111)
Creatinine, Ser: 0.6 mg/dL (ref 0.44–1.00)
GFR, Estimated: >60 mL/min (ref >60)
Glucose, Bld: 90 mg/dL (ref 70–99)
Potassium: 4.2 mmol/L (ref 3.5–5.1)
Sodium: 138 mmol/L (ref 135–145)

## 2024-04-06 LAB — HCG, QUANTITATIVE, PREGNANCY: hCG, Beta Chain, Quant, S: 112 m[IU]/mL — ABNORMAL HIGH (ref <5)

## 2024-04-06 LAB — POC URINE PREG, ED: Preg Test, Ur: NEGATIVE

## 2024-04-06 MED ORDER — HYDROCODONE-ACETAMINOPHEN 5-325 MG PO TABS: 1.0000 | ORAL_TABLET | Freq: Three times a day (TID) | ORAL | 0 refills | Status: AC | PRN

## 2024-04-06 NOTE — ED Triage Notes (Signed)
 Pt comes with vaginal bleeding that started yesterday and worse  today. Pt states miscarriage month ago. Pt states clots and dark colored blood.

## 2024-04-06 NOTE — Discharge Instructions (Addendum)
 Your beta quant remains elevated at 112, and your ultrasound confirms retained products of conception.  You should follow-up with your primary provider as preferred, for ongoing expectant management of your incomplete miscarriage.  Return to the ED as needed.

## 2024-04-06 NOTE — ED Provider Notes (Signed)
 [facility] Emergency Department Provider Note     Event Date/Time   First MD Initiated Contact with Patient 04/06/24 1723     (approximate)   History   Vaginal Bleeding   HPI  Abigail [name] is a 23 y.o. female G1P0 presents to the ED reporting a recent spontaneous miscarriage.  She reports this pregnancy and subsequent miscarriage have been followed by her PCP.  By her admission, she felt a follow-up 2 weeks ago for serial beta quant testing.  Today she presents after she began to experience some heavy vaginal bleeding yesterday.  Patient also reports that she returned to using her NuvaRing for birth control, and remove the most recent NuvaRing last week, after the appropriate 3-week duration.  Patient denies any fever, chills, chest pain, shortness of breath.  Physical Exam   Triage Vital Signs: ED Triage Vitals [04/06/24 1623]  Encounter Vitals Group     BP 117/73     Girls Systolic BP Percentile      Girls Diastolic BP Percentile      Boys Systolic BP Percentile      Boys Diastolic BP Percentile      Pulse Rate 70     Resp 17     Temp 98.5 F (36.9 C)     Temp src      SpO2 100 %     Weight      Height      Head Circumference      Peak Flow      Pain Score 8     Pain Loc      Pain Education      Exclude from Growth Chart     Most recent vital signs: Vitals:   04/06/24 1623 04/06/24 2152  BP: 117/73 116/71  Pulse: 70 (!) 54  Resp: 17 16  Temp: 98.5 F (36.9 C) 98.4 F (36.9 C)  SpO2: 100% 99%    General Awake, no distress. NAD HEENT NCAT. PERRL. EOMI. No rhinorrhea. Mucous membranes are moist.  CV:  Good peripheral perfusion. RRR RESP:  Normal effort. CTA ABD:  No distention.  GYN:  deferred   ED Results / Procedures / Treatments   Labs (all labs ordered are listed, but only abnormal results are displayed) Labs Reviewed  CBC WITH DIFFERENTIAL/PLATELET - Abnormal; Notable for the following components:      Result  Value   WBC 3.5 (*)    Hemoglobin 11.7 (*)    HCT 35.9 (*)    Neutro Abs 1.3 (*)    All other components within normal limits  URINALYSIS, ROUTINE W REFLEX MICROSCOPIC - Abnormal; Notable for the following components:   Color, Urine   (*)    Value: TEST NOT REPORTED DUE TO COLOR INTERFERENCE OF URINE PIGMENT   APPearance   (*)    Value: TEST NOT REPORTED DUE TO COLOR INTERFERENCE OF URINE PIGMENT   Glucose, UA   (*)    Value: TEST NOT REPORTED DUE TO COLOR INTERFERENCE OF URINE PIGMENT   Hgb urine dipstick   (*)    Value: TEST NOT REPORTED DUE TO COLOR INTERFERENCE OF URINE PIGMENT   Bilirubin Urine   (*)    Value: TEST NOT REPORTED DUE TO COLOR INTERFERENCE OF URINE PIGMENT   Ketones, ur   (*)    Value: TEST NOT REPORTED DUE TO COLOR INTERFERENCE OF URINE PIGMENT   Protein, ur   (*)    Value: TEST NOT REPORTED  DUE TO COLOR INTERFERENCE OF URINE PIGMENT   Nitrite   (*)    Value: TEST NOT REPORTED DUE TO COLOR INTERFERENCE OF URINE PIGMENT   Leukocytes,Ua   (*)    Value: TEST NOT REPORTED DUE TO COLOR INTERFERENCE OF URINE PIGMENT   All other components within normal limits  HCG, QUANTITATIVE, PREGNANCY - Abnormal; Notable for the following components:   hCG, Beta Chain, Quant, S 112 (*)    All other components within normal limits  BASIC METABOLIC PANEL WITH GFR  POC URINE PREG, ED    EKG   RADIOLOGY  I personally viewed and evaluated these images as part of my medical decision making, as well as reviewing the written report by the radiologist.  ED Provider Interpretation: Endometrium hypervascular  US  PELVIC COMPLETE W TRANSVAGINAL AND TORSION R/O Result Date: 04/06/2024 EXAM: US  Pelvis, Complete Transvaginal and Transabdominal with Doppler 04/06/2024 09:32:29 PM TECHNIQUE: Transabdominal and transvaginal pelvic duplex ultrasound using B-mode/gray scaled imaging with Doppler spectral analysis and color flow was obtained. COMPARISON: None available CLINICAL HISTORY: Pelvic  pain 390131; [id number] Vaginal bleeding [id number]. miscarriage 1 month ago. FINDINGS: UTERUS: Uterus measures 7.7 x 4.3 x 4.6 cm, volume 80 ml. Uterus demonstrates normal myometrial echotexture. ENDOMETRIAL STRIPE: Endometrium measures up to 18 mm in thickness. The endometrium is diffusely heterogeneous with areas of hypervascularity. RIGHT OVARY: Right ovary measures 1.8 x 1.3 x 4.2 cm, volume 5 ml. There is normal arterial and venous Doppler flow. LEFT OVARY: Left ovary measures 2.6 x 1.3 x 1.6 cm, volume 2.7 cm. There is normal arterial and venous Doppler flow. FREE FLUID: No free fluid. IMPRESSION: 1. Heterogeneous, hypervascular endometrium measuring up to 18 mm, suspicious for retained products of conception 2. Normal ovaries and adnexa Electronically signed by: Greig Pique MD 04/06/2024 09:37 PM EDT RP Workstation: [id number]    PROCEDURES:  Critical Care performed: No  Procedures   MEDICATIONS ORDERED IN ED: Medications - No data to display   IMPRESSION / MDM / ASSESSMENT AND PLAN / ED COURSE  I reviewed the triage vital signs and the nursing notes.                              Differential diagnosis includes, but is not limited to, ovarian cyst, ovarian torsion, acute appendicitis, diverticulitis, urinary tract infection/pyelonephritis, endometriosis, bowel obstruction, colitis, renal colic, gastroenteritis, hernia, fibroids, pregnancy related pain including ectopic pregnancy, etc.   Patient's presentation is most consistent with acute complicated illness / injury requiring diagnostic workup.  Patient's diagnosis is consistent with incomplete miscarriage.  Patient presents to the ED after she missed lab follow-up with her primary provider.  She noted the onset of some heavy vaginal bleeding today after she completed 3 weeks of her intravaginal birth control.  Patient with otherwise reassuring exam and no acute lab abnormalities today.  No critical anemia is noted.  Ultrasound shows what  appears to be some retained products of conception.  Beta quant is elevated at 112.  Patient will be discharged home with instructions to follow-up with her primary provider for ongoing management.  She may opt for expectant management versus medical or surgical intervention.  Patient was offered referral information for local OBG, but would prefer to follow-up with her primary provider. Patient is to follow up with her PCP as suggested, as needed or otherwise directed. Patient is given ED precautions to return to the ED for any worsening or new symptoms.  FINAL CLINICAL IMPRESSION(S) / ED DIAGNOSES   Final diagnoses:  Retained products of conception after miscarriage     Rx / DC Orders   ED Discharge Orders          Ordered    HYDROcodone-acetaminophen  (NORCO/VICODIN) 5-325 MG tablet  3 times daily PRN        04/06/24 2156             Note:  This document was prepared using Dragon voice recognition software and may include unintentional dictation errors.    Loyd Candida LULLA Aldona, PA-C 04/06/24 2305    Dorothyann Drivers, MD 04/06/24 2332

## 2024-06-18 ENCOUNTER — Ambulatory Visit
Admission: EM | Admit: 2024-06-18 | Discharge: 2024-06-18 | Disposition: A | Discharge status: Home / Self Care | Attending: Emergency Medicine | Admitting: Emergency Medicine

## 2024-06-18 DIAGNOSIS — J069 Acute upper respiratory infection, unspecified: Secondary | ICD-10-CM | POA: Diagnosis present

## 2024-06-18 LAB — POCT RAPID STREP A (OFFICE): Rapid Strep A Screen: NEGATIVE

## 2024-06-18 MED ORDER — BENZONATATE 100 MG PO CAPS: 200.0000 mg | ORAL_CAPSULE | Freq: Three times a day (TID) | ORAL | 0 refills | Status: AC

## 2024-06-18 MED ORDER — IPRATROPIUM BROMIDE 0.06 % NA SOLN: 2.0000 | Freq: Four times a day (QID) | NASAL | 12 refills | Status: AC

## 2024-06-18 MED ORDER — PROMETHAZINE-DM 6.25-15 MG/5ML PO SYRP: 5.0000 mL | ORAL_SOLUTION | Freq: Four times a day (QID) | ORAL | 0 refills | Status: AC | PRN

## 2024-06-18 NOTE — ED Triage Notes (Signed)
 Pt c/o body aches, sore throat, nasal congestion x2days

## 2024-06-18 NOTE — Discharge Instructions (Addendum)
 Your testing today was negative for strep.  We will send the swab for culture.  I do believe you have a respiratory virus which is causing your symptoms.  Use over-the-counter Tylenol  and/or ibuprofen  according to the package instructions as needed for any fever or pain.  Gargle with warm salt water as often as you like.  Mix 1 tablespoon of table salt in 8 ounces of warm water, gargle and spit to soothe your sore throat.  You may use over-the-counter Chloraseptic or Sucrets lozenges, no more than 1 lozenge every 2 hours as the menthol may give you diarrhea.  Use the Atrovent  nasal spray, 2 squirts in each nostril every 6 hours, as needed for runny nose and postnasal drip.  Use the Tessalon  Perles every 8 hours during the day.  Take them with a small sip of water.  They may give you some numbness to the base of your tongue or a metallic taste in your mouth, this is normal.  Use the Promethazine  DM cough syrup at bedtime for cough and congestion.  It will make you drowsy so do not take it during the day.  Return for reevaluation or see your primary care provider for any new or worsening symptoms.

## 2024-06-18 NOTE — ED Provider Notes (Signed)
 " [facility]    CSN: [id number] Arrival date & time: 06/18/24  1356      History   Chief Complaint Chief Complaint  Patient presents with   Sore Throat        Generalized Body Aches         HPI Abigail [name] is a 23 y.o. female.   HPI  23 year old female with past medical history significant for MDD presents for evaluation of bodyaches, runny nose, nasal congestion for clear mucus, sore throat, and a nonproductive cough that started 2 days ago.  She is unaware of any sick contacts and denies recent travel to state or country.  History reviewed. No pertinent past medical history.  Patient Active Problem List   Diagnosis Date Noted   MDD (major depressive disorder), single episode, severe , no psychosis (HCC) 11/19/2018   Suicidal overdose, subsequent encounter 11/19/2018    History reviewed. No pertinent surgical history.  OB History   No obstetric history on file.      Home Medications    Prior to Admission medications  Medication Sig Start Date End Date Taking? Authorizing Provider  benzonatate  (TESSALON ) 100 MG capsule Take 2 capsules (200 mg total) by mouth every 8 (eight) hours. 06/18/24  Yes Bernardino Ditch, NP  hydrocortisone  cream 1 % Apply to affected area 2 times daily 01/12/22  Yes Stanhope, Catharine M, FNP  ipratropium (ATROVENT ) 0.06 % nasal spray Place 2 sprays into both nostrils 4 (four) times daily. 06/18/24  Yes Bernardino Ditch, NP  meloxicam  (MOBIC ) 15 MG tablet Take 1 tablet (15 mg total) by mouth daily. 03/21/24 03/21/25 Yes Fisher, Devere ORN, PA-C  promethazine -dextromethorphan (PROMETHAZINE -DM) 6.25-15 MG/5ML syrup Take 5 mLs by mouth 4 (four) times daily as needed. 06/18/24  Yes Bernardino Ditch, NP    Family History History reviewed. No pertinent family history.  Social History Social History[1]   Allergies   Patient has no known allergies.   Review of Systems Review of Systems  Constitutional:  Negative for fever.  HENT:   Positive for congestion, rhinorrhea and sore throat. Negative for ear pain.   Respiratory:  Positive for cough. Negative for shortness of breath and wheezing.   Musculoskeletal:  Positive for arthralgias and myalgias.     Physical Exam Triage Vital Signs ED Triage Vitals  Encounter Vitals Group     BP 06/18/24 1529 (!) 117/90     Girls Systolic BP Percentile --      Girls Diastolic BP Percentile --      Boys Systolic BP Percentile --      Boys Diastolic BP Percentile --      Pulse Rate 06/18/24 1529 96     Resp --      Temp 06/18/24 1529 99 F (37.2 C)     Temp Source 06/18/24 1529 Oral     SpO2 06/18/24 1529 98 %     Weight 06/18/24 1527 148 lb 9.6 oz (67.4 kg)     Height --      Head Circumference --      Peak Flow --      Pain Score 06/18/24 1527 8     Pain Loc --      Pain Education --      Exclude from Growth Chart --    No data found.  Updated Vital Signs BP (!) 117/90 (BP Location: Left Arm)   Pulse 96   Temp 99 F (37.2 C) (Oral)   Wt 148 lb 9.6  oz (67.4 kg)   LMP 05/27/2024   SpO2 98%   BMI 26.32 kg/m   Visual Acuity Right Eye Distance:   Left Eye Distance:   Bilateral Distance:    Right Eye Near:   Left Eye Near:    Bilateral Near:     Physical Exam Vitals and nursing note reviewed.  Constitutional:      Appearance: Normal appearance. She is not ill-appearing.  HENT:     Head: Normocephalic and atraumatic.     Right Ear: Tympanic membrane, ear canal and external ear normal. There is no impacted cerumen.     Left Ear: Tympanic membrane, ear canal and external ear normal. There is no impacted cerumen.     Nose: Congestion and rhinorrhea present.     Comments: Nasal mucosa is edematous and erythematous with scant clear discharge in both nares.    Mouth/Throat:     Mouth: Mucous membranes are moist.     Pharynx: Oropharynx is clear. No oropharyngeal exudate or posterior oropharyngeal erythema.  Cardiovascular:     Rate and Rhythm: Normal rate  and regular rhythm.     Pulses: Normal pulses.     Heart sounds: Normal heart sounds. No murmur heard.    No friction rub. No gallop.  Pulmonary:     Effort: Pulmonary effort is normal.     Breath sounds: Normal breath sounds. No wheezing, rhonchi or rales.  Musculoskeletal:     Cervical back: Normal range of motion and neck supple. No tenderness.  Lymphadenopathy:     Cervical: No cervical adenopathy.  Skin:    General: Skin is warm and dry.     Capillary Refill: Capillary refill takes less than 2 seconds.     Findings: No rash.  Neurological:     General: No focal deficit present.     Mental Status: She is alert and oriented to person, place, and time.      UC Treatments / Results  Labs (all labs ordered are listed, but only abnormal results are displayed) Labs Reviewed  POCT RAPID STREP A (OFFICE) - Normal  CULTURE, GROUP A STREP [facility])    EKG   Radiology No results found.  Procedures Procedures (including critical care time)  Medications Ordered in UC Medications - No data to display  Initial Impression / Assessment and Plan / UC Course  I have reviewed the triage vital signs and the nursing notes.  Pertinent labs & imaging results that were available during my care of the patient were reviewed by me and considered in my medical decision making (see chart for details).   Patient is a nontoxic-appearing 23 year old female presenting for evaluation of respiratory symptoms that started 2 days ago.  Her most prominent symptom is a sore throat.  On exam her oropharynx is free of redness or erythema.  No tonsillar edema or exudate noted.  No cervical lymphadenopathy present.  Differential diagnose include viral respiratory infection versus strep pharyngitis.  I will order a rapid strep.  Rapid strep is negative.  I will send swab for culture.  I will discharge patient on the diagnosis of viral URI with cough.  Prescribe Atrovent  nasal spray for nasal congestion and  Tessalon  Perles and Promethazine  DM cough syrup for cough and congestion.  She may use over-the-counter Tylenol  and/or ibuprofen  as needed for any fever or pain.  Chloraseptic and Sucrets lozenges to help with sore throat pain along with salt water gargles.  Return precautions reviewed.  Work note provided.  Final Clinical Impressions(s) / UC Diagnoses   Final diagnoses:  Viral URI with cough     Discharge Instructions      Your testing today was negative for strep.  We will send the swab for culture.  I do believe you have a respiratory virus which is causing your symptoms.  Use over-the-counter Tylenol  and/or ibuprofen  according to the package instructions as needed for any fever or pain.  Gargle with warm salt water as often as you like.  Mix 1 tablespoon of table salt in 8 ounces of warm water, gargle and spit to soothe your sore throat.  You may use over-the-counter Chloraseptic or Sucrets lozenges, no more than 1 lozenge every 2 hours as the menthol may give you diarrhea.  Use the Atrovent  nasal spray, 2 squirts in each nostril every 6 hours, as needed for runny nose and postnasal drip.  Use the Tessalon  Perles every 8 hours during the day.  Take them with a small sip of water.  They may give you some numbness to the base of your tongue or a metallic taste in your mouth, this is normal.  Use the Promethazine  DM cough syrup at bedtime for cough and congestion.  It will make you drowsy so do not take it during the day.  Return for reevaluation or see your primary care provider for any new or worsening symptoms.      ED Prescriptions     Medication Sig Dispense Auth. Provider   benzonatate  (TESSALON ) 100 MG capsule Take 2 capsules (200 mg total) by mouth every 8 (eight) hours. 21 capsule Bernardino Ditch, NP   ipratropium (ATROVENT ) 0.06 % nasal spray Place 2 sprays into both nostrils 4 (four) times daily. 15 mL Bernardino Ditch, NP   promethazine -dextromethorphan (PROMETHAZINE -DM)  6.25-15 MG/5ML syrup Take 5 mLs by mouth 4 (four) times daily as needed. 118 mL Bernardino Ditch, NP      PDMP not reviewed this encounter.     [1]  Social History Tobacco Use   Smoking status: Never   Smokeless tobacco: Never  Vaping Use   Vaping status: Never Used  Substance Use Topics   Alcohol use: Never   Drug use: Never     Bernardino Ditch, NP 06/18/24 1621  "

## 2024-06-21 ENCOUNTER — Encounter (HOSPITAL_COMMUNITY): Payer: Self-pay

## 2024-06-21 ENCOUNTER — Ambulatory Visit (HOSPITAL_COMMUNITY): Payer: Self-pay

## 2024-06-21 ENCOUNTER — Emergency Department (HOSPITAL_COMMUNITY)

## 2024-06-21 ENCOUNTER — Emergency Department (HOSPITAL_COMMUNITY): Admission: EM | Admit: 2024-06-21 | Discharge: 2024-06-21 | Disposition: A | Discharge status: Home / Self Care

## 2024-06-21 ENCOUNTER — Other Ambulatory Visit: Payer: Self-pay

## 2024-06-21 DIAGNOSIS — J069 Acute upper respiratory infection, unspecified: Secondary | ICD-10-CM | POA: Diagnosis not present

## 2024-06-21 DIAGNOSIS — R0981 Nasal congestion: Secondary | ICD-10-CM | POA: Diagnosis present

## 2024-06-21 LAB — URINALYSIS, ROUTINE W REFLEX MICROSCOPIC
Bacteria, UA: NONE SEEN
Bilirubin Urine: NEGATIVE
Glucose, UA: NEGATIVE mg/dL
Ketones, ur: NEGATIVE mg/dL
Leukocytes,Ua: NEGATIVE
Nitrite: NEGATIVE
Protein, ur: 100 mg/dL — AB
RBC / HPF: >50 RBC/hpf (ref 0–5)
Specific Gravity, Urine: 1.026 (ref 1.005–1.030)
pH: 5 (ref 5.0–8.0)

## 2024-06-21 LAB — CULTURE, GROUP A STREP (THRC)

## 2024-06-21 LAB — PREGNANCY, URINE: Preg Test, Ur: NEGATIVE

## 2024-06-21 LAB — POC URINE PREG, ED: Preg Test, Ur: NEGATIVE

## 2024-06-21 MED ORDER — ONDANSETRON 4 MG PO TBDP: 4.0000 mg | ORAL_TABLET | Freq: Three times a day (TID) | ORAL | 0 refills | Status: AC | PRN

## 2024-06-21 NOTE — ED Notes (Signed)
 Urine Preg was negative.

## 2024-06-21 NOTE — ED Provider Notes (Signed)
 " [place] EMERGENCY DEPARTMENT AT [facility] Provider Note   CSN: [id number] Arrival date & time: 06/21/24  1209     Patient presents with: Illness   Abigail [name] is a 23 y.o. female.    Illness 23 year old female presenting today with flu like symptoms.  Patient reports that these have been going on for about a week.  She was last seen at urgent care on 06/19/2024 and was diagnosed with a URI.  Patient reports that the symptoms have remained the same since then.  While there she was prescribed Tessalon , Atrovent  nasal spray, and promethazine  DM.  She reports that these helped a little better but still having some symptoms.  She reports they have not gotten any better or worse.  She denies any chest pain or shortness of breath.  Does report she is having headaches, congestion, body aches, sore throat, and weakness.     Prior to Admission medications  Medication Sig Start Date End Date Taking? Authorizing Provider  benzonatate  (TESSALON ) 100 MG capsule Take 2 capsules (200 mg total) by mouth every 8 (eight) hours. 06/18/24   Bernardino Ditch, NP  hydrocortisone  cream 1 % Apply to affected area 2 times daily 01/12/22   Stanhope, Catharine M, FNP  ipratropium (ATROVENT ) 0.06 % nasal spray Place 2 sprays into both nostrils 4 (four) times daily. 06/18/24   Bernardino Ditch, NP  meloxicam  (MOBIC ) 15 MG tablet Take 1 tablet (15 mg total) by mouth daily. 03/21/24 03/21/25  Gasper Devere ORN, PA-C  promethazine -dextromethorphan (PROMETHAZINE -DM) 6.25-15 MG/5ML syrup Take 5 mLs by mouth 4 (four) times daily as needed. 06/18/24   Bernardino Ditch, NP    Allergies: Patient has no known allergies.    Review of Systems  Updated Vital Signs BP 111/78 (BP Location: Right Arm)   Pulse (!) 105   Temp 98.8 F (37.1 C) (Oral)   Resp 14   Ht 5' 3 (1.6 m)   Wt 67.1 kg   LMP 06/21/2024   SpO2 99%   BMI 26.22 kg/m   Physical Exam Vitals and nursing note reviewed.  HENT:     Head:     Jaw: No  tenderness.     Nose: Congestion and rhinorrhea present. No septal deviation. Rhinorrhea is clear.     Right Turbinates: Swollen.     Left Turbinates: Swollen.     Right Sinus: No maxillary sinus tenderness or frontal sinus tenderness.     Left Sinus: No maxillary sinus tenderness or frontal sinus tenderness.     Mouth/Throat:     Mouth: Mucous membranes are moist. No angioedema.     Pharynx: Oropharynx is clear. No oropharyngeal exudate, posterior oropharyngeal erythema or uvula swelling.     Tonsils: No tonsillar exudate or tonsillar abscesses.  Cardiovascular:     Rate and Rhythm: Normal rate.     Pulses: Normal pulses.  Pulmonary:     Effort: Pulmonary effort is normal. No respiratory distress.     Breath sounds: Normal breath sounds. No stridor. No wheezing, rhonchi or rales.  Chest:     Chest wall: No tenderness.  Skin:    General: Skin is warm and dry.  Neurological:     General: No focal deficit present.     Mental Status: She is alert.     (all labs ordered are listed, but only abnormal results are displayed) Labs Reviewed  URINALYSIS, ROUTINE W REFLEX MICROSCOPIC - Abnormal; Notable for the following components:      Result  Value   Color, Urine AMBER (*)    APPearance HAZY (*)    Hgb urine dipstick LARGE (*)    Protein, ur 100 (*)    All other components within normal limits  PREGNANCY, URINE    EKG: None  Radiology: DG Chest 2 View Result Date: 06/21/2024 EXAM: 2 VIEW(S) XRAY OF THE CHEST 06/21/2024 01:56:00 PM COMPARISON: 03/21/2024 CLINICAL HISTORY: FINDINGS: LUNGS AND PLEURA: No focal pulmonary opacity. No pleural effusion. No pneumothorax. HEART AND MEDIASTINUM: No acute abnormality of the cardiac and mediastinal silhouettes. BONES AND SOFT TISSUES: No acute osseous abnormality. IMPRESSION: 1. No acute process. Electronically signed by: Waddell Calk MD 06/21/2024 02:57 PM EST RP Workstation: [id number]     Procedures   Medications Ordered in the ED -  No data to display                                  Medical Decision Making  Impression: 23 year old female presenting with flulike symptoms.  Differential diagnoses include flu, COVID, RSV, viral URI  Additional History: Patient was able to provide history.  I also reviewed previous urgent care and other outpatient visits.  Labs: Urinalysis showed no signs of infection.  Imaging: X-ray showed no cardiac or pulmonary abnormalities.  I personally reviewed this and agree with the radiologist report.  ED Course/Meds: Patient remained stable while in the ER.  Most likely diagnosis is viral URI due to symptoms going on for about a week.  Patient denies any shortness of breath or chest pain.  Chest x-ray showed no signs of pneumonia or other pulmonary causes.  I spoke with the patient and informed her to continue taking the medications that she was given at urgent care also prescribed nausea that she is having of rest and hydration.  I told her if she started to continue having the symptoms to follow-up with her primary care within the next few days.  Have any worsening symptoms to return to the ER.      Final diagnoses:  Viral upper respiratory tract infection    ED Discharge Orders     None          Abigail [name] MATSU, [place] 06/21/24 1632  "

## 2024-06-21 NOTE — ED Triage Notes (Signed)
 Patient arrives POV from home c/c illness symptoms including weakness, chills, headache, cough, runny nose since 12/21.

## 2024-06-21 NOTE — Discharge Instructions (Addendum)
 Continue to rest and hydrate.  You may continue taking the medications were prescribed at urgent care.  I have also prescribed you some Zofran for the nausea.  If you start to develop any chest pain, shortness of breath, worsening of symptoms please return to the ER.  If the symptoms continue for more than 3 days follow-up with your primary care for further testing.
